# Patient Record
Sex: Male | Born: 1980 | Hispanic: Yes | Marital: Single | Smoking: Current every day smoker
Health system: Southern US, Community
[De-identification: ages and names within clinical notes are randomized; demographics above are authoritative.]

## PROBLEM LIST (undated history)

## (undated) DIAGNOSIS — R519 Headache, unspecified: Secondary | ICD-10-CM

## (undated) HISTORY — PX: OTHER SURGICAL HISTORY: SHX169

---

## 2017-05-23 ENCOUNTER — Other Ambulatory Visit: Payer: Self-pay

## 2017-05-23 ENCOUNTER — Emergency Department
Admission: EM | Admit: 2017-05-23 | Discharge: 2017-05-23 | Disposition: A | Discharge status: Other Acute Inpatient Hospital | Payer: Self-pay | Attending: Emergency Medicine | Admitting: Emergency Medicine

## 2017-05-23 ENCOUNTER — Emergency Department: Payer: Self-pay

## 2017-05-23 DIAGNOSIS — Y998 Other external cause status: Secondary | ICD-10-CM | POA: Insufficient documentation

## 2017-05-23 DIAGNOSIS — Y9389 Activity, other specified: Secondary | ICD-10-CM | POA: Insufficient documentation

## 2017-05-23 DIAGNOSIS — Y929 Unspecified place or not applicable: Secondary | ICD-10-CM | POA: Insufficient documentation

## 2017-05-23 DIAGNOSIS — W3189XA Contact with other specified machinery, initial encounter: Secondary | ICD-10-CM | POA: Insufficient documentation

## 2017-05-23 DIAGNOSIS — S62512A Displaced fracture of proximal phalanx of left thumb, initial encounter for closed fracture: Secondary | ICD-10-CM | POA: Insufficient documentation

## 2017-05-23 DIAGNOSIS — S62512B Displaced fracture of proximal phalanx of left thumb, initial encounter for open fracture: Secondary | ICD-10-CM

## 2017-05-23 MED ORDER — TETANUS-DIPHTH-ACELL PERTUSSIS 5-2.5-18.5 LF-MCG/0.5 IM SUSP
0.5000 mL | Freq: Once | INTRAMUSCULAR | Status: DC
  Filled 2017-05-23: qty 0.5

## 2017-05-23 MED ORDER — LIDOCAINE HCL (PF) 1 % IJ SOLN
INTRAMUSCULAR | Status: AC
  Filled 2017-05-23: qty 5

## 2017-05-23 MED ORDER — CEFAZOLIN SODIUM-DEXTROSE 2-4 GM/100ML-% IV SOLN
2.0000 g | Freq: Once | INTRAVENOUS | Status: DC
  Filled 2017-05-23: qty 100

## 2017-05-23 MED ORDER — CEFTRIAXONE SODIUM 1 G IJ SOLR
2.0000 g | Freq: Once | INTRAMUSCULAR | Status: AC
Start: 2017-05-23 — End: 2017-05-23
  Administered 2017-05-23: 2 g via INTRAMUSCULAR
  Filled 2017-05-23: qty 20

## 2017-05-23 MED ORDER — OXYCODONE-ACETAMINOPHEN 5-325 MG PO TABS
2.0000 | ORAL_TABLET | Freq: Once | ORAL | Status: AC
  Administered 2017-05-23: 2 via ORAL
  Filled 2017-05-23: qty 2

## 2017-05-23 NOTE — ED Notes (Signed)
RN called [facility] ED and spoke to Harless NakayamaKatie, Charge RN and gave report. No further questions. MD has updated pt on POV transfer. Pt in NAD and friend verbalized he will be driving the transfer.

## 2017-05-23 NOTE — ED Provider Notes (Signed)
[facility] Emergency Department Provider Note  ____________________________________________  Time seen: Approximately 3:14 PM  I have reviewed the triage vital signs and the nursing notes.   HISTORY  Chief Complaint Laceration   HPI Terry Ramirez is a 37 y.o. male no significant past medical history who presents for evaluation of a laceration of his left thumb. Patient is right-handed. He reports that he was working with an Mining engineerelectric saw when he sustained the laceration. This happened around 2 PM. Last tetanus shot was 1 year ago. Patient is complaining of numbness from the laceration distally on his left thumb. He is also complaining of mild to moderate constant sharp pain that is worse with movement of the finger. He denies any other injuries.  PMH None  Meds None  Allergies Patient has no known allergies.  FH Not relevant  Social History Smoker: no Alcohol: no Drugs: no  Review of Systems  Constitutional: Negative for fever. Eyes: Negative for visual changes. ENT: Negative for sore throat. Neck: No neck pain  Cardiovascular: Negative for chest pain. Respiratory: Negative for shortness of breath. Gastrointestinal: Negative for abdominal pain, vomiting or diarrhea. Musculoskeletal: Negative for back pain. Skin: Negative for rash. + laceration of there L thumb Neurological: Negative for headaches, weakness or numbness. Psych: No SI or HI  ____________________________________________   PHYSICAL EXAM:  VITAL SIGNS: ED Triage Vitals  Enc Vitals Group     BP 05/23/17 1359 114/64     Pulse Rate 05/23/17 1359 (!) 104     Resp 05/23/17 1359 20     Temp 05/23/17 1359 98.6 F (37 C)     Temp Source 05/23/17 1359 Oral     SpO2 05/23/17 1359 97 %     Weight --      Height 05/23/17 1401 5\' 10"  (1.778 m)     Head Circumference --      Peak Flow --      Pain Score 05/23/17 1400 5     Pain Loc --      Pain Edu? --      Excl. in GC?  --     Constitutional: Alert and oriented. Well appearing and in no apparent distress. HEENT:      Head: Normocephalic and atraumatic.         Eyes: Conjunctivae are normal. Sclera is non-icteric.       Mouth/Throat: Mucous membranes are moist.       Neck: Supple with no signs of meningismus. Cardiovascular: Regular rate and rhythm. No murmurs, gallops, or rubs. 2+ symmetrical distal pulses are present in all extremities. No JVD. Respiratory: Normal respiratory effort. Lungs are clear to auscultation bilaterally. No wheezes, crackles, or rhonchi.  Gastrointestinal: Soft, non tender, and non distended with positive bowel sounds. No rebound or guarding. Musculoskeletal: There is an open fracture of the proximal phalanx of the left thumb, hemostatic, the thumb is dusky and cyanotic with no capillary refill, patient is able to flex and extend the thumb, decrease sensation on the distal aspect of thumb Neurologic: Normal speech and language. Face is symmetric. Moving all extremities. No gross focal neurologic deficits are appreciated. Skin: Skin is warm, dry and intact. No rash noted. Psychiatric: Mood and affect are normal. Speech and behavior are normal.  ____________________________________________   LABS (all labs ordered are listed, but only abnormal results are displayed)  Labs Reviewed - No data to display ____________________________________________  EKG  none  ____________________________________________  RADIOLOGY  I have personally reviewed the  images performed during this visit and I agree with the Radiologist's read.   Interpretation by Radiologist:  Dg Finger Thumb Left  Result Date: 05/23/2017 CLINICAL DATA:  Left thumb laceration with saw. EXAM: LEFT THUMB 2+V COMPARISON:  None. FINDINGS: Comminuted and displaced fracture is seen in the first proximal phalanx with large associated soft tissue laceration. Joint spaces are intact. No definite radiopaque foreign body  seen in region of laceration. IMPRESSION: Large soft tissue laceration involving left thumb with associated comminuted and displaced fracture of first proximal phalanx. Electronically Signed   By: Lupita Raider, M.D.   On: 05/23/2017 15:25      ____________________________________________   PROCEDURES  Procedure(s) performed: None Procedures Critical Care performed:  None ____________________________________________   INITIAL IMPRESSION / ASSESSMENT AND PLAN / ED COURSE  R handed patient presents with a open fracture of the left thumb sustained by a electric saw at 2 PM. Exam is concerning and showing an open fracture of the proximal phalanx of the left thumb, hemostatic, the thumb is dusky and cyanotic with no capillary refill, patient is able to flex and extend the thumb, decrease sensation on the distal aspect of thumb. will wash the wound thoroughly, tetanus up to date, will give Ancef and speak with hand surgery [facility] for transfer.  Clinical Course as of May 24 1626  Sat May 23, 2017  1612 Patient accepted by Dr. Marin Shutter at [facility] for transfer. Will go via private vehicle to expedite care. Transfer ED to ED.    [CV]    Clinical Course User Index [CV] Don Perking Washington, MD     As part of my medical decision making, I reviewed the following data within the electronic MEDICAL RECORD NUMBER Nursing notes reviewed and incorporated, Radiograph reviewed , A consult was requested and obtained from this/these consultant(s) Hand Surgery, Notes from prior ED visits and [place] Controlled Substance Database    Pertinent labs & imaging results that were available during my care of the patient were reviewed by me and considered in my medical decision making (see chart for details).    ____________________________________________   FINAL CLINICAL IMPRESSION(S) / ED DIAGNOSES  Final diagnoses:  Open displaced fracture of proximal phalanx of left thumb, initial encounter      NEW  MEDICATIONS STARTED DURING THIS VISIT:  ED Discharge Orders    None       Note:  This document was prepared using Dragon voice recognition software and may include unintentional dictation errors.    Nita Sickle, MD 05/23/17 [phone]

## 2017-05-23 NOTE — ED Notes (Signed)
Discoloration noted to the left thumb below the laceration. Tendons noted in laceration. No bone noted. Bleeding is controlled but continues to weep a small amount of blood into dressing. No evidence of arterial bleed and no part of description by pt sounds arterial in nature.

## 2017-05-23 NOTE — ED Notes (Signed)
New dressing applied to thumb. Bleeding remains slow but under control. Finger was soaked in betadine and sterile water for extended amount of time.

## 2017-05-23 NOTE — ED Triage Notes (Signed)
Cut thumb L hand with electrics saw about 30 min prior to arrival. Glove on at arrival. Removed. Deep lac back of L thumb. Is able to move thumb and bleeding controlled.

## 2019-03-26 IMAGING — DX DG FINGER THUMB 2+V*L*
3 series · 3 of 3 positions shown · non-contrast
Comparison: None.

CLINICAL DATA: Left thumb laceration with saw.

EXAM:
LEFT THUMB 2+V

[finger ap]
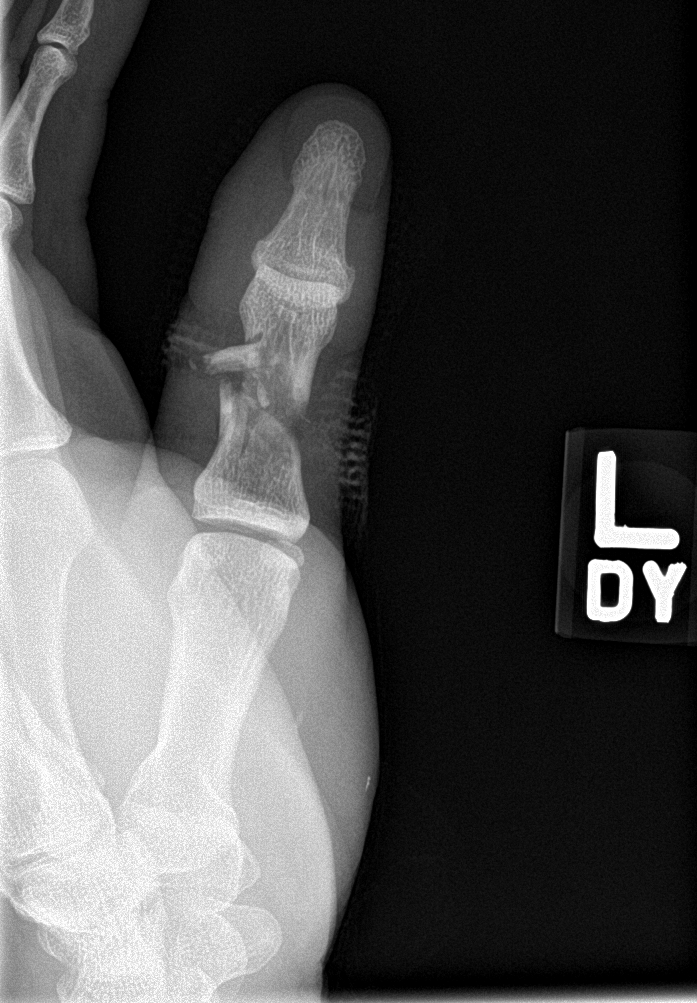

[finger obl]
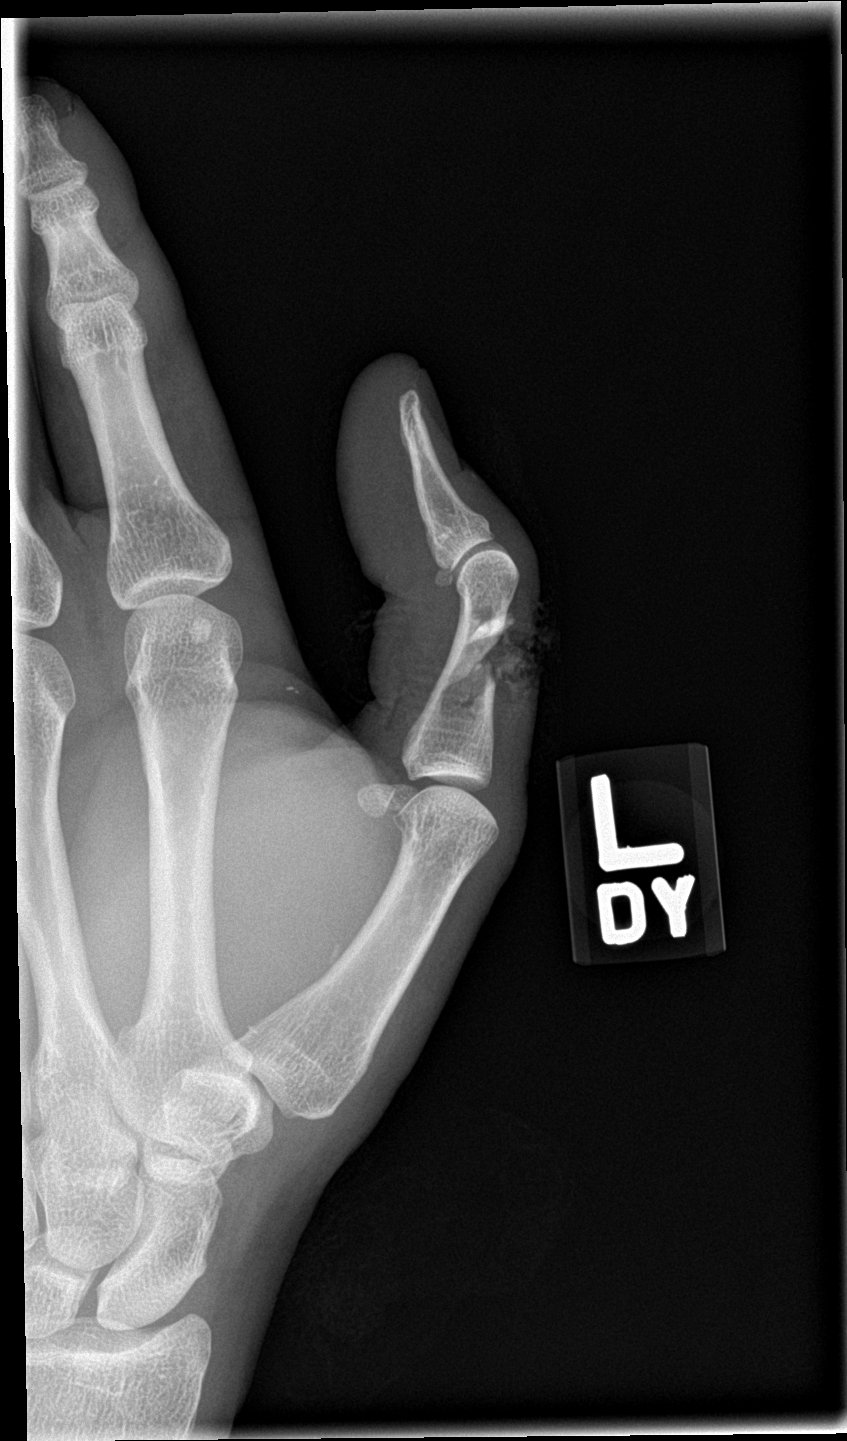

[finger lat]
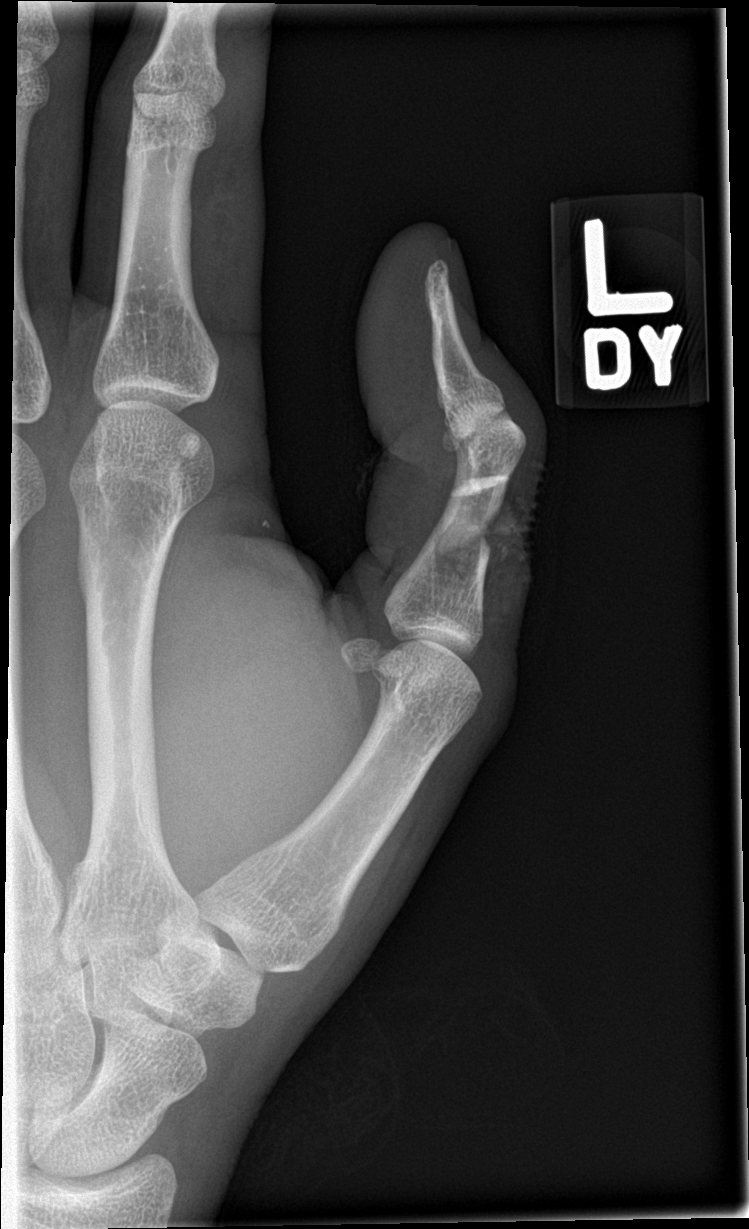

[3 of 3 positions shown; findings below may reference images not displayed]

FINDINGS: Comminuted and displaced fracture is seen in the first proximal
phalanx with large associated soft tissue laceration. Joint spaces
are intact. No definite radiopaque foreign body seen in region of
laceration.
IMPRESSION: Large soft tissue laceration involving left thumb with associated
comminuted and displaced fracture of first proximal phalanx.

## 2022-12-08 ENCOUNTER — Emergency Department (HOSPITAL_COMMUNITY)
Admission: EM | Admit: 2022-12-08 | Discharge: 2022-12-08 | Disposition: A | Discharge status: Home / Self Care | Payer: Self-pay | Attending: Emergency Medicine | Admitting: Emergency Medicine

## 2022-12-08 ENCOUNTER — Encounter (HOSPITAL_COMMUNITY): Payer: Self-pay | Admitting: Orthopedic Surgery

## 2022-12-08 ENCOUNTER — Inpatient Hospital Stay (HOSPITAL_COMMUNITY): Payer: Self-pay | Admitting: Anesthesiology

## 2022-12-08 ENCOUNTER — Emergency Department (HOSPITAL_COMMUNITY): Payer: Self-pay

## 2022-12-08 ENCOUNTER — Encounter (HOSPITAL_COMMUNITY)
Admission: AD | Disposition: A | Discharge status: Home / Self Care | Payer: Self-pay | Source: Ambulatory Visit | Attending: Orthopedic Surgery

## 2022-12-08 ENCOUNTER — Ambulatory Visit (HOSPITAL_COMMUNITY)
Admission: AD | Admit: 2022-12-08 | Discharge: 2022-12-08 | Disposition: A | Discharge status: Home / Self Care | Payer: Self-pay | Source: Ambulatory Visit | Attending: Orthopedic Surgery | Admitting: Orthopedic Surgery

## 2022-12-08 ENCOUNTER — Other Ambulatory Visit (HOSPITAL_COMMUNITY): Payer: Self-pay

## 2022-12-08 ENCOUNTER — Ambulatory Visit (HOSPITAL_COMMUNITY): Admit: 2022-12-08 | Payer: Self-pay

## 2022-12-08 ENCOUNTER — Other Ambulatory Visit: Payer: Self-pay

## 2022-12-08 ENCOUNTER — Encounter (HOSPITAL_COMMUNITY)
Admission: EM | Disposition: A | Discharge status: Home / Self Care | Payer: Self-pay | Source: Home / Self Care | Attending: Emergency Medicine

## 2022-12-08 ENCOUNTER — Ambulatory Visit (HOSPITAL_COMMUNITY): Admission: AD | Admit: 2022-12-08 | Payer: Self-pay | Source: Ambulatory Visit | Admitting: Orthopedic Surgery

## 2022-12-08 ENCOUNTER — Encounter (HOSPITAL_COMMUNITY): Payer: Self-pay

## 2022-12-08 DIAGNOSIS — S67190A Crushing injury of right index finger, initial encounter: Secondary | ICD-10-CM

## 2022-12-08 DIAGNOSIS — W230XXA Caught, crushed, jammed, or pinched between moving objects, initial encounter: Secondary | ICD-10-CM | POA: Insufficient documentation

## 2022-12-08 DIAGNOSIS — W231XXA Caught, crushed, jammed, or pinched between stationary objects, initial encounter: Secondary | ICD-10-CM | POA: Insufficient documentation

## 2022-12-08 DIAGNOSIS — S62630B Displaced fracture of distal phalanx of right index finger, initial encounter for open fracture: Secondary | ICD-10-CM | POA: Insufficient documentation

## 2022-12-08 DIAGNOSIS — Z23 Encounter for immunization: Secondary | ICD-10-CM | POA: Insufficient documentation

## 2022-12-08 DIAGNOSIS — Y990 Civilian activity done for income or pay: Secondary | ICD-10-CM | POA: Insufficient documentation

## 2022-12-08 HISTORY — PX: PERCUTANEOUS PINNING: SHX2209

## 2022-12-08 HISTORY — PX: I & D EXTREMITY: SHX5045

## 2022-12-08 HISTORY — DX: Headache, unspecified: R51.9

## 2022-12-08 LAB — CBC
HCT: 47.2 % (ref 39.0–52.0)
Hemoglobin: 15.9 g/dL (ref 13.0–17.0)
MCH: 29 pg (ref 26.0–34.0)
MCHC: 33.7 g/dL (ref 30.0–36.0)
MCV: 86.1 fL (ref 80.0–100.0)
Platelets: 256 10*3/uL (ref 150–400)
RBC: 5.48 MIL/uL (ref 4.22–5.81)
RDW: 13.2 % (ref 11.5–15.5)
WBC: 11.9 10*3/uL — ABNORMAL HIGH (ref 4.0–10.5)
nRBC: 0 % (ref 0.0–0.2)

## 2022-12-08 LAB — SURGICAL PCR SCREEN
MRSA, PCR: NEGATIVE
Staphylococcus aureus: NEGATIVE

## 2022-12-08 LAB — BASIC METABOLIC PANEL
Anion gap: 9 (ref 5–15)
BUN: 11 mg/dL (ref 6–20)
CO2: 23 mmol/L (ref 22–32)
Calcium: 9 mg/dL (ref 8.9–10.3)
Chloride: 103 mmol/L (ref 98–111)
Creatinine, Ser: 0.99 mg/dL (ref 0.61–1.24)
GFR, Estimated: >60 mL/min (ref >60)
Glucose, Bld: 100 mg/dL — ABNORMAL HIGH (ref 70–99)
Potassium: 3.9 mmol/L (ref 3.5–5.1)
Sodium: 135 mmol/L (ref 135–145)

## 2022-12-08 SURGERY — IRRIGATION AND DEBRIDEMENT EXTREMITY
Anesthesia: Monitor Anesthesia Care | Laterality: Right

## 2022-12-08 SURGERY — IRRIGATION AND DEBRIDEMENT EXTREMITY
Anesthesia: General | Laterality: Right

## 2022-12-08 MED ORDER — KETOROLAC TROMETHAMINE 30 MG/ML IJ SOLN
INTRAMUSCULAR | Status: AC
  Filled 2022-12-08: qty 1

## 2022-12-08 MED ORDER — CEPHALEXIN 500 MG PO CAPS: 500.0000 mg | ORAL_CAPSULE | Freq: Two times a day (BID) | ORAL | 0 refills | Status: AC

## 2022-12-08 MED ORDER — ORAL CARE MOUTH RINSE: 15.0000 mL | Freq: Once | OROMUCOSAL | Status: DC

## 2022-12-08 MED ORDER — POVIDONE-IODINE 10 % EX SWAB: 2.0000 | Freq: Once | CUTANEOUS | Status: DC

## 2022-12-08 MED ORDER — CEPHALEXIN 500 MG PO CAPS
500.0000 mg | ORAL_CAPSULE | Freq: Two times a day (BID) | ORAL | 0 refills | Status: AC
  Filled 2022-12-08: qty 20, 10d supply, fill #0

## 2022-12-08 MED ORDER — CHLORHEXIDINE GLUCONATE 0.12 % MT SOLN: 15.0000 mL | Freq: Once | OROMUCOSAL | Status: DC

## 2022-12-08 MED ORDER — OXYCODONE HCL 5 MG PO TABS: 5.0000 mg | ORAL_TABLET | Freq: Once | ORAL | Status: DC | PRN

## 2022-12-08 MED ORDER — TETANUS-DIPHTH-ACELL PERTUSSIS 5-2.5-18.5 LF-MCG/0.5 IM SUSY
0.5000 mL | PREFILLED_SYRINGE | Freq: Once | INTRAMUSCULAR | Status: AC
  Administered 2022-12-08: 0.5 mL via INTRAMUSCULAR
  Filled 2022-12-08: qty 0.5

## 2022-12-08 MED ORDER — LIDOCAINE HCL (PF) 1 % IJ SOLN
INTRAMUSCULAR | Status: AC
  Filled 2022-12-08: qty 30

## 2022-12-08 MED ORDER — SODIUM CHLORIDE 0.9 % IV SOLN
INTRAVENOUS | Status: DC | PRN
Start: 2022-12-08 — End: 2022-12-08

## 2022-12-08 MED ORDER — KETAMINE HCL 50 MG/5ML IJ SOSY
PREFILLED_SYRINGE | INTRAMUSCULAR | Status: AC
  Filled 2022-12-08: qty 5

## 2022-12-08 MED ORDER — CHLORHEXIDINE GLUCONATE 0.12 % MT SOLN
OROMUCOSAL | Status: AC
  Filled 2022-12-08: qty 15

## 2022-12-08 MED ORDER — ONDANSETRON HCL 4 MG/2ML IJ SOLN: 4.0000 mg | Freq: Four times a day (QID) | INTRAMUSCULAR | Status: DC | PRN

## 2022-12-08 MED ORDER — GLYCOPYRROLATE PF 0.2 MG/ML IJ SOSY
PREFILLED_SYRINGE | INTRAMUSCULAR | Status: DC | PRN
Start: 2022-12-08 — End: 2022-12-08
  Administered 2022-12-08: .2 mg via INTRAVENOUS

## 2022-12-08 MED ORDER — MIDAZOLAM HCL 2 MG/2ML IJ SOLN
INTRAMUSCULAR | Status: AC
  Filled 2022-12-08: qty 2

## 2022-12-08 MED ORDER — MIDAZOLAM HCL 2 MG/2ML IJ SOLN
INTRAMUSCULAR | Status: DC | PRN
  Administered 2022-12-08: 2 mg via INTRAVENOUS

## 2022-12-08 MED ORDER — CEFAZOLIN SODIUM-DEXTROSE 1-4 GM/50ML-% IV SOLN
1.0000 g | Freq: Once | INTRAVENOUS | Status: AC
  Administered 2022-12-08: 1 g via INTRAVENOUS
  Filled 2022-12-08: qty 50

## 2022-12-08 MED ORDER — FENTANYL CITRATE (PF) 250 MCG/5ML IJ SOLN
INTRAMUSCULAR | Status: AC
  Filled 2022-12-08: qty 5

## 2022-12-08 MED ORDER — CHLORHEXIDINE GLUCONATE 4 % EX SOLN: 60.0000 mL | Freq: Once | CUTANEOUS | Status: DC

## 2022-12-08 MED ORDER — FENTANYL CITRATE (PF) 250 MCG/5ML IJ SOLN
INTRAMUSCULAR | Status: DC | PRN
Start: 2022-12-08 — End: 2022-12-08
  Administered 2022-12-08: 100 ug via INTRAVENOUS

## 2022-12-08 MED ORDER — LACTATED RINGERS IV SOLN: INTRAVENOUS | Status: DC

## 2022-12-08 MED ORDER — PROPOFOL 10 MG/ML IV BOLUS
INTRAVENOUS | Status: AC
  Filled 2022-12-08: qty 20

## 2022-12-08 MED ORDER — LIDOCAINE 2% (20 MG/ML) 5 ML SYRINGE
INTRAMUSCULAR | Status: AC
  Filled 2022-12-08: qty 5

## 2022-12-08 MED ORDER — KETOROLAC TROMETHAMINE 30 MG/ML IJ SOLN
INTRAMUSCULAR | Status: DC | PRN
Start: 2022-12-08 — End: 2022-12-08
  Administered 2022-12-08: 30 mg via INTRAVENOUS

## 2022-12-08 MED ORDER — OXYCODONE HCL 5 MG/5ML PO SOLN: 5.0000 mg | Freq: Once | ORAL | Status: DC | PRN

## 2022-12-08 MED ORDER — KETAMINE HCL 50 MG/5ML IJ SOSY
PREFILLED_SYRINGE | INTRAMUSCULAR | Status: DC | PRN
Start: 2022-12-08 — End: 2022-12-08
  Administered 2022-12-08: 5 mg via INTRAVENOUS
  Administered 2022-12-08: 15 mg via INTRAVENOUS

## 2022-12-08 MED ORDER — ONDANSETRON HCL 4 MG/2ML IJ SOLN
INTRAMUSCULAR | Status: AC
  Filled 2022-12-08: qty 2

## 2022-12-08 MED ORDER — PROPOFOL 500 MG/50ML IV EMUL
INTRAVENOUS | Status: DC | PRN
Start: 2022-12-08 — End: 2022-12-08
  Administered 2022-12-08: 100 ug/kg/min via INTRAVENOUS

## 2022-12-08 MED ORDER — ONDANSETRON HCL 4 MG/2ML IJ SOLN
INTRAMUSCULAR | Status: DC | PRN
  Administered 2022-12-08: 4 mg via INTRAVENOUS

## 2022-12-08 MED ORDER — LIDOCAINE HCL (PF) 1 % IJ SOLN
INTRAMUSCULAR | Status: DC | PRN
Start: 2022-12-08 — End: 2022-12-08
  Administered 2022-12-08: 10 mL

## 2022-12-08 MED ORDER — CEFAZOLIN SODIUM-DEXTROSE 2-4 GM/100ML-% IV SOLN
2.0000 g | INTRAVENOUS | Status: AC
  Administered 2022-12-08: 2 g via INTRAVENOUS
  Filled 2022-12-08: qty 100

## 2022-12-08 MED ORDER — FENTANYL CITRATE (PF) 100 MCG/2ML IJ SOLN: 25.0000 ug | INTRAMUSCULAR | Status: DC | PRN

## 2022-12-08 SURGICAL SUPPLY — 84 items
APL PRP STRL LF DISP 70% ISPRP (MISCELLANEOUS) ×1
APL SKNCLS STERI-STRIP NONHPOA (GAUZE/BANDAGES/DRESSINGS)
BAG COUNTER SPONGE SURGICOUNT (BAG) ×1 IMPLANT
BAG SPNG CNTER NS LX DISP (BAG) ×1
BENZOIN TINCTURE PRP APPL 2/3 (GAUZE/BANDAGES/DRESSINGS) IMPLANT
BLADE CLIPPER SURG (BLADE) IMPLANT
BNDG CMPR 5X3 KNIT ELC UNQ LF (GAUZE/BANDAGES/DRESSINGS) ×1
BNDG CMPR 5X4 CHSV STRCH STRL (GAUZE/BANDAGES/DRESSINGS) ×1
BNDG CMPR 75X21 PLY HI ABS (MISCELLANEOUS)
BNDG CMPR 9X4 STRL LF SNTH (GAUZE/BANDAGES/DRESSINGS)
BNDG COHESIVE 4X5 TAN STRL LF (GAUZE/BANDAGES/DRESSINGS) IMPLANT
BNDG ELASTIC 2X5.8 VLCR STR LF (GAUZE/BANDAGES/DRESSINGS) ×1 IMPLANT
BNDG ELASTIC 3INX 5YD STR LF (GAUZE/BANDAGES/DRESSINGS) ×1 IMPLANT
BNDG ELASTIC 4X5.8 VLCR STR LF (GAUZE/BANDAGES/DRESSINGS) ×1 IMPLANT
BNDG ESMARK 4X9 LF (GAUZE/BANDAGES/DRESSINGS) IMPLANT
BNDG GAUZE DERMACEA FLUFF 4 (GAUZE/BANDAGES/DRESSINGS) ×2 IMPLANT
BNDG GZE DERMACEA 4 6PLY (GAUZE/BANDAGES/DRESSINGS) ×2
CAP PIN ORTHO PINK (CAP) IMPLANT
CHLORAPREP W/TINT 26 (MISCELLANEOUS) ×1 IMPLANT
CORD BIPOLAR FORCEPS 12FT (ELECTRODE) ×1 IMPLANT
COVER BACK TABLE 60X90IN (DRAPES) IMPLANT
COVER K-WIRE PIN .062/1.6 (PIN) ×1
COVER MAYO STAND STRL (DRAPES) ×1 IMPLANT
COVER SURGICAL LIGHT HANDLE (MISCELLANEOUS) ×1 IMPLANT
CUFF TOURN SGL QUICK 18X4 (TOURNIQUET CUFF) ×1 IMPLANT
CUFF TOURN SGL QUICK 24 (TOURNIQUET CUFF)
CUFF TRNQT CYL 24X4X16.5-23 (TOURNIQUET CUFF) IMPLANT
DRAIN PENROSE 18X1/4 LTX STRL (DRAIN) IMPLANT
DRAPE HALF SHEET 40X57 (DRAPES) ×1 IMPLANT
DRAPE OEC MINIVIEW 54X84 (DRAPES) ×1 IMPLANT
DRAPE SURG 17X23 STRL (DRAPES) ×1 IMPLANT
DRSG ADAPTIC 3X8 NADH LF (GAUZE/BANDAGES/DRESSINGS) ×1 IMPLANT
DRSG EMULSION OIL 3X3 NADH (GAUZE/BANDAGES/DRESSINGS) IMPLANT
GAUZE SPONGE 4X4 12PLY STRL (GAUZE/BANDAGES/DRESSINGS) ×1 IMPLANT
GAUZE STRETCH 2X75IN STRL (MISCELLANEOUS) IMPLANT
GAUZE XEROFORM 1X8 LF (GAUZE/BANDAGES/DRESSINGS) ×1 IMPLANT
GLOVE BIO SURGEON STRL SZ7 (GLOVE) ×1 IMPLANT
GLOVE BIOGEL PI IND STRL 8.5 (GLOVE) ×1 IMPLANT
GLOVE SURG ORTHO 8.0 STRL STRW (GLOVE) ×1 IMPLANT
GLOVE SURG UNDER POLY LF SZ7 (GLOVE) ×1 IMPLANT
GOWN STRL REUS W/ TWL LRG LVL3 (GOWN DISPOSABLE) ×2 IMPLANT
GOWN STRL REUS W/ TWL XL LVL3 (GOWN DISPOSABLE) ×2 IMPLANT
GOWN STRL REUS W/TWL LRG LVL3 (GOWN DISPOSABLE) ×2
GOWN STRL REUS W/TWL XL LVL3 (GOWN DISPOSABLE) ×2
IV CATH 18G X1.75 CATHLON (IV SOLUTION) IMPLANT
KIT BASIN OR (CUSTOM PROCEDURE TRAY) ×1 IMPLANT
KIT TURNOVER KIT B (KITS) ×1 IMPLANT
LOOP VASCLR MAXI BLUE 18IN ST (MISCELLANEOUS) IMPLANT
LOOP VASCULAR MAXI 18 BLUE (MISCELLANEOUS)
LOOPS VASCLR MAXI BLUE 18IN ST (MISCELLANEOUS) IMPLANT
MANIFOLD NEPTUNE II (INSTRUMENTS) IMPLANT
NDL HYPO 25GX1X1/2 BEV (NEEDLE) IMPLANT
NDL HYPO 25X1 1.5 SAFETY (NEEDLE) ×1 IMPLANT
NDL KEITH (NEEDLE) IMPLANT
NEEDLE HYPO 25GX1X1/2 BEV (NEEDLE) IMPLANT
NEEDLE HYPO 25X1 1.5 SAFETY (NEEDLE) ×1 IMPLANT
NEEDLE KEITH (NEEDLE) IMPLANT
NS IRRIG 1000ML POUR BTL (IV SOLUTION) ×1 IMPLANT
PACK ORTHO EXTREMITY (CUSTOM PROCEDURE TRAY) ×1 IMPLANT
PAD ABD 8X10 STRL (GAUZE/BANDAGES/DRESSINGS) ×1 IMPLANT
PAD ARMBOARD 7.5X6 YLW CONV (MISCELLANEOUS) ×2 IMPLANT
PAD CAST 4YDX4 CTTN HI CHSV (CAST SUPPLIES) ×2 IMPLANT
PADDING CAST ABS COTTON 3X4 (CAST SUPPLIES) ×1 IMPLANT
PADDING CAST COTTON 4X4 STRL (CAST SUPPLIES) ×2
PIN GUARD 1.57MM GREEN STERILE (PIN) IMPLANT
SOAP 2 % CHG 4 OZ (WOUND CARE) ×1 IMPLANT
SPLINT PLASTER CAST XFAST 3X15 (CAST SUPPLIES) ×1 IMPLANT
SPONGE T-LAP 4X18 ~~LOC~~+RFID (SPONGE) ×1 IMPLANT
STRIP CLOSURE SKIN 1/2X4 (GAUZE/BANDAGES/DRESSINGS) IMPLANT
SUT ETHILON 4 0 P 3 18 (SUTURE) IMPLANT
SUT FIBERWIRE 3-0 18 TAPR NDL (SUTURE)
SUT NYLON ETHILON 5-0 P-3 1X18 (SUTURE) IMPLANT
SUT PROLENE 4 0 P 3 18 (SUTURE) IMPLANT
SUTURE FIBERWR 3-0 18 TAPR NDL (SUTURE) IMPLANT
SWAB CULTURE ESWAB REG 1ML (MISCELLANEOUS) IMPLANT
SYR BULB EAR ULCER 3OZ GRN STR (SYRINGE) ×1 IMPLANT
SYR CONTROL 10ML LL (SYRINGE) IMPLANT
TOWEL GREEN STERILE (TOWEL DISPOSABLE) ×1 IMPLANT
TOWEL GREEN STERILE FF (TOWEL DISPOSABLE) ×2 IMPLANT
TUBE CONNECTING 12X1/4 (SUCTIONS) IMPLANT
UNDERPAD 30X36 HEAVY ABSORB (UNDERPADS AND DIAPERS) ×1 IMPLANT
VASCULAR TIE MAXI BLUE 18IN ST (MISCELLANEOUS)
WATER STERILE IRR 1000ML POUR (IV SOLUTION) ×1 IMPLANT
YANKAUER SUCT BULB TIP NO VENT (SUCTIONS) IMPLANT

## 2022-12-08 NOTE — Op Note (Signed)
NAME: Terry Ramirez MEDICAL RECORD NO: [id number] DATE OF BIRTH: [birth year] FACILITY: [facility] LOCATION: [facility] OR PHYSICIAN: Samuella Cota, MD   OPERATIVE REPORT   DATE OF PROCEDURE: 12/08/22    PREOPERATIVE DIAGNOSIS: Right index finger open distal phalanx fracture   POSTOPERATIVE DIAGNOSIS: Right index finger open distal phalanx fracture   PROCEDURE: Right index finger open distal phalanx fracture irrigation and debridement Right index finger open distal phalanx fracture fixation with pinning   SURGEON:  Samuella Cota, M.D.   ASSISTANT: None   ANESTHESIA:  Local with sedation   INTRAVENOUS FLUIDS:  Per anesthesia flow sheet.   ESTIMATED BLOOD LOSS:  Minimal.   COMPLICATIONS:  None.   SPECIMENS:  none   TOURNIQUET TIME:   Not utilized for this case   DISPOSITION:  Stable to PACU.   INDICATIONS: This is a 42 year old male who sustained a crush injury to the right index finger and sustained an open fracture of the index finger distal phalanx.  Given the amount of soft tissue disruption, patient was indicated for right index finger irrigation debridement with fixation of the distal phalanx fracture.  Risks and benefits of surgery were discussed including the risks of infection, bleeding, scarring, stiffness, nerve injury, vascular injury, tendon injury, need for subsequent operation, ongoing infection, need for recurrent washout as well as, nonunion, malunion.  He voiced understanding of these risks and elected to proceed.  OPERATIVE COURSE: Patient was seen and identified in the preoperative area and marked appropriately.  Surgical consent had been signed. Preoperative IV antibiotic prophylaxis was given. He was transferred to the operating room and placed in supine position with the Right Right upper extremity on an arm board.  Sedation was induced by the anesthesiologist.  Right upper extremity was prepped and draped in normal sterile orthopedic fashion.  A surgical  pause was performed between the surgeons, anesthesia, and operating room staff and all were in agreement as to the patient, procedure, and site of procedure.  Tourniquet was placed and padded appropriately to the right upper arm, however this was not utilized for this case.  10 cc of 1% lidocaine plain was utilized for digital block purposes of the index finger.  Copious irrigation was then performed of the wound site at the distal phalanx.  Once appropriate irrigation was completed, gentle debridement of the soft tissue in this region was performed.  Nailbed was carefully inspected, and noted to be intact without significant disruption.  Gentle reduction maneuver was then performed of the distal phalanx fracture under biplanar fluoroscopy.  0.062 K wire was then inserted in retrograde fashion to stabilize the fracture site.  Wire placement was confirmed with biplanar fluoroscopy.  There was noted to be appropriate bony apposition.  At this juncture, 4-0 nylon was then utilized to close the wound from the initial trauma over the dorsal aspect of the index finger.  Wound is approximately 2 cm in nature stemming from the eponychial fold to the level of the DIP joint.  Skin edges were able to be well-approximated.  Sterile dressing was then applied followed by application of a static finger splint for the index finger.  The patient was awoken from anesthesia safely and taken to PACU in stable condition.  He will be discharged home tonight with oral antibiotics.  I will see him back in the office in 1 week for postoperative followup.     Samuella Cota, MD Electronically signed, 12/08/22

## 2022-12-08 NOTE — Anesthesia Preprocedure Evaluation (Signed)
Anesthesia Evaluation  Patient identified by MRN, date of birth, ID band Patient awake    Reviewed: Allergy & Precautions, H&P , NPO status , Patient's Chart, lab work & pertinent test results  Airway Mallampati: II   Neck ROM: full    Dental   Pulmonary Current Smoker   breath sounds clear to auscultation       Cardiovascular negative cardio ROS  Rhythm:regular Rate:Normal     Neuro/Psych  Headaches    GI/Hepatic   Endo/Other    Renal/GU      Musculoskeletal   Abdominal   Peds  Hematology   Anesthesia Other Findings   Reproductive/Obstetrics                             Anesthesia Physical Anesthesia Plan  ASA: 2  Anesthesia Plan: General   Post-op Pain Management:    Induction: Intravenous  PONV Risk Score and Plan: 1 and Ondansetron, Dexamethasone, Midazolam and Treatment may vary due to age or medical condition  Airway Management Planned: LMA  Additional Equipment:   Intra-op Plan:   Post-operative Plan: Extubation in OR  Informed Consent: I have reviewed the patients History and Physical, chart, labs and discussed the procedure including the risks, benefits and alternatives for the proposed anesthesia with the patient or authorized representative who has indicated his/her understanding and acceptance.     Dental advisory given  Plan Discussed with: CRNA, Anesthesiologist and Surgeon  Anesthesia Plan Comments:        Anesthesia Quick Evaluation

## 2022-12-08 NOTE — Consult Note (Signed)
Reason for Consult:Right index finger open fx Referring Physician: Bethann Berkshire Time called: 1105 Time at bedside: 1141   Terry Ramirez is an 42 y.o. male.  HPI: Hersh was working today and struck his right index finger with a hammer. He had a large wound and felt like it was broken. He came to the ED where x-rays showed a distal phalanx fx and hand surgery was consulted. He is RHD and works in [employer].  History reviewed. No pertinent past medical history.  History reviewed. No pertinent surgical history.  History reviewed. No pertinent family history.  Social History:  reports that he has never smoked. He has never used smokeless tobacco. He reports that he does not currently use alcohol. He reports that he does not currently use drugs.  Allergies: No Known Allergies  Medications: I have reviewed the patient's current medications.  No results found for this or any previous visit (from the past 48 hour(s)).  DG Finger Index Right  Result Date: 12/08/2022 CLINICAL DATA:  Crush injury. EXAM: RIGHT INDEX FINGER 3V COMPARISON:  None Available. FINDINGS: There is comminuted displaced fracture of the midshaft of the distal phalanx of second digit. Soft tissue swelling and irregularity identified as well. No additional fracture or dislocation. Preserved joint spaces and bone mineralization. IMPRESSION: There is comminuted displaced fracture of the midshaft of the distal phalanx of second digit. Soft tissue swelling and irregularity identified as well. Electronically Signed   By: Karen Kays M.D.   On: 12/08/2022 11:32    Review of Systems  HENT:  Negative for ear discharge, ear pain, hearing loss and tinnitus.   Eyes:  Negative for photophobia and pain.  Respiratory:  Negative for cough and shortness of breath.   Cardiovascular:  Negative for chest pain.  Gastrointestinal:  Negative for abdominal pain, nausea and vomiting.  Genitourinary:  Negative for dysuria, flank pain,  frequency and urgency.  Musculoskeletal:  Positive for arthralgias (Right index finger). Negative for back pain, myalgias and neck pain.  Neurological:  Negative for dizziness and headaches.  Hematological:  Does not bruise/bleed easily.  Psychiatric/Behavioral:  The patient is not nervous/anxious.    Blood pressure (!) 146/80, pulse 74, temperature 97.9 F (36.6 C), temperature source Oral, resp. rate 19, height 5\' 9"  (1.753 m), weight 68 kg, SpO2 100%. Physical Exam Constitutional:      General: He is not in acute distress.    Appearance: He is well-developed. He is not diaphoretic.  HENT:     Head: Normocephalic and atraumatic.  Eyes:     General: No scleral icterus.       Right eye: No discharge.        Left eye: No discharge.     Conjunctiva/sclera: Conjunctivae normal.  Cardiovascular:     Rate and Rhythm: Normal rate and regular rhythm.  Pulmonary:     Effort: Pulmonary effort is normal. No respiratory distress.  Musculoskeletal:     Cervical back: Normal range of motion.     Comments: Right shoulder, elbow, wrist, digits- Laceration radial aspect P3 index finger, mod TTP, no instability, no blocks to motion  Sens  Ax/R/M/U intact  Mot   Ax/ R/ PIN/ M/ AIN/ U intact  Rad 2+  Skin:    General: Skin is warm and dry.  Neurological:     Mental Status: He is alert.  Psychiatric:        Mood and Affect: Mood normal.        Behavior: Behavior normal.  Assessment/Plan: Right index finger open fx -- Will need Ancef and tetanus. Plan I&D, pinning at [facility] today with Dr. Denese Killings. Please keep NPO. Anticipate discharge after surgery.    Freeman Caldron, PA-C Orthopedic Surgery [phone] 12/08/2022, 11:50 AM

## 2022-12-08 NOTE — ED Provider Notes (Signed)
[place] EMERGENCY DEPARTMENT AT [facility] Provider Note   CSN: [id number] Arrival date & time: 12/08/22  0865     History  Chief Complaint  Patient presents with   Finger Injury    Terry Ramirez is a 42 y.o. male With noncontributory past medical history who is not up-to-date on his tetanus presents concern for crush injury of right pointer finger.  Patient smashed his right index finger with a sledgehammer accidentally.  He reports that the distal finger is asleep.  HPI     Home Medications Prior to Admission medications   Not on File      Allergies    Patient has no known allergies.    Review of Systems   Review of Systems  All other systems reviewed and are negative.   Physical Exam Updated Vital Signs BP (!) 146/80   Pulse 74   Temp 97.9 F (36.6 C) (Oral)   Resp 19   Ht 5\' 9"  (1.753 m)   Wt 68 kg   SpO2 100%   BMI 22.15 kg/m  Physical Exam Vitals and nursing note reviewed.  Constitutional:      General: He is not in acute distress.    Appearance: Normal appearance.  HENT:     Head: Normocephalic and atraumatic.  Eyes:     General:        Right eye: No discharge.        Left eye: No discharge.  Cardiovascular:     Rate and Rhythm: Normal rate and regular rhythm.  Pulmonary:     Effort: Pulmonary effort is normal. No respiratory distress.  Musculoskeletal:        General: No deformity.  Skin:    General: Skin is warm and dry.     Comments: Open laceration with deformity noted of right pointer finger, nail is attached but damaged  Neurological:     Mental Status: He is alert and oriented to person, place, and time.  Psychiatric:        Mood and Affect: Mood normal.        Behavior: Behavior normal.     ED Results / Procedures / Treatments   Labs (all labs ordered are listed, but only abnormal results are displayed) Labs Reviewed  CBC - Abnormal; Notable for the following components:      Result Value   WBC 11.9 (*)     All other components within normal limits  BASIC METABOLIC PANEL - Abnormal; Notable for the following components:   Glucose, Bld 100 (*)    All other components within normal limits    EKG None  Radiology DG Finger Index Right  Result Date: 12/08/2022 CLINICAL DATA:  Crush injury. EXAM: RIGHT INDEX FINGER 3V COMPARISON:  None Available. FINDINGS: There is comminuted displaced fracture of the midshaft of the distal phalanx of second digit. Soft tissue swelling and irregularity identified as well. No additional fracture or dislocation. Preserved joint spaces and bone mineralization. IMPRESSION: There is comminuted displaced fracture of the midshaft of the distal phalanx of second digit. Soft tissue swelling and irregularity identified as well. Electronically Signed   By: Karen Kays M.D.   On: 12/08/2022 11:32    Procedures Procedures    Medications Ordered in ED Medications  Tdap (BOOSTRIX) injection 0.5 mL (0.5 mLs Intramuscular Given 12/08/22 1112)  ceFAZolin (ANCEF) IVPB 1 g/50 mL premix (0 g Intravenous Stopped 12/08/22 1215)    ED Course/ Medical Decision Making/ A&P  Medical Decision Making Amount and/or Complexity of Data Reviewed Labs: ordered. Radiology: ordered.  Risk Prescription drug management.   This patient is a 42 y.o. male  who presents to the ED for concern of crush injury of right pointer finger with sledgehammer.  Tetanus not up-to-date..   Differential diagnoses prior to evaluation: The emergent differential diagnosis includes, but is not limited to, open fracture, open dislocation, laceration, contusion, versus other. This is not an exhaustive differential.   Past Medical History / Co-morbidities / Social History: Overall noncontributory, not up-to-date on tetanus  Physical Exam: Physical exam performed. The pertinent findings include: Open laceration with deformity noted of right pointer finger, nail is attached  to skin at this time but damage noted, looseness along nailbed.  Fracture is visible and exploration of wound.  Lab Tests/Imaging studies: I personally interpreted labs/imaging and the pertinent results include: CBC with mild elevation of white blood cells 11.9, likely secondary to stress from traumatic injury.  His BMP is unremarkable.  I independently interpreted plain film radiograph of the right index finger which shows significant soft tissue swelling which is consistent with his large laceration, and a displaced comminuted fracture of the distal phalanx is noted.. I agree with the radiologist interpretation.   Medications: I ordered medication including Ancef for open fracture, Tdap for tetanus prophylaxis.  I have reviewed the patients home medicines and have made adjustments as needed.  Consults: Spoke with the Hydrographic surveyor, Dr. Denese Killings, as well as Earney Hamburg, PA-C and the patient will be sent to the OR at [facility] for open fracture repair after wound is cleaned and wrapped.  He will go POV with coworker.  Wound appropriately cleaned and dressed, and patient sent POV with instructions.   Disposition: After consideration of the diagnostic results and the patients response to treatment, I feel that patient will need surgery for open fracture of right index finger.  Ancef administered in the emergency department.  Patient transferred POV to preop admissions.   emergency department workup does not suggest an emergent condition requiring admission or immediate intervention beyond what has been performed at this time. The plan is: as above. The patient is safe for discharge and has been instructed to return immediately for worsening symptoms, change in symptoms or any other concerns.  Final Clinical Impression(s) / ED Diagnoses Final diagnoses:  Open displaced fracture of distal phalanx of right index finger, initial encounter    Rx / DC Orders ED Discharge Orders     None          [place] 12/08/22 1245    Bethann Berkshire, MD 12/11/22 1454

## 2022-12-08 NOTE — Discharge Instructions (Addendum)
Dirjase inmediatamente a la [facility] y dgales que lo estn atendiendo el cirujano, el Dr. Denese Killings, y el mdico asistente Earney Hamburg. Puede dejar el vendaje de la va intravenosa puesto.

## 2022-12-08 NOTE — ED Triage Notes (Signed)
Patient was hammering wood and crushed his right pointer finger. Laceration on inner pointer finger. Stated it feels broken. Tetanus not updated.

## 2022-12-08 NOTE — H&P (Signed)
HAND SURGERY H&P  REQUESTING PHYSICIAN: Samuella Cota, MD   Chief Complaint: Right index finger injury  HPI: Terry Ramirez is a 42 y.o. male who presents with right index finger injury sustained earlier today.  Mechanism described as a crush injury from a hammer.  This is a work-related injury.  He is right-hand dominant.  He is a current smoker.  No other associated injuries to the hand on clinical radiographic workup.  Hand dominance: Right Occupation: Holiday representative  Past Medical History:  Diagnosis Date   Headache    migraines   Past Surgical History:  Procedure Laterality Date   thumb surgery Left    2020   Social History   Socioeconomic History   Marital status: Single    Spouse name: Not on file   Number of children: Not on file   Years of education: Not on file   Highest education level: Not on file  Occupational History   Not on file  Tobacco Use   Smoking status: Every Day    Types: Cigars   Smokeless tobacco: Never  Vaping Use   Vaping status: Never Used  Substance and Sexual Activity   Alcohol use: Not Currently   Drug use: Not Currently   Sexual activity: Not on file  Other Topics Concern   Not on file  Social History Narrative   Not on file   Social Determinants of Health   Financial Resource Strain: Not on file  Food Insecurity: Not on file  Transportation Needs: Not on file  Physical Activity: Not on file  Stress: Not on file  Social Connections: Not on file   History reviewed. No pertinent family history. - negative except otherwise stated in the family history section No Known Allergies Prior to Admission medications   Medication Sig Start Date End Date Taking? Authorizing Provider  ibuprofen (ADVIL) 200 MG tablet Take 200-400 mg by mouth as needed for headache.   Yes [provider]  PRESCRIPTION MEDICATION Take 1 tablet by mouth as needed (headaches/migraines). Sydolil 1mg -50mg -100mg    Yes [provider]    DG Finger Index Right  Result Date: 12/08/2022 CLINICAL DATA:  Crush injury. EXAM: RIGHT INDEX FINGER 3V COMPARISON:  None Available. FINDINGS: There is comminuted displaced fracture of the midshaft of the distal phalanx of second digit. Soft tissue swelling and irregularity identified as well. No additional fracture or dislocation. Preserved joint spaces and bone mineralization. IMPRESSION: There is comminuted displaced fracture of the midshaft of the distal phalanx of second digit. Soft tissue swelling and irregularity identified as well. Electronically Signed   By: Karen Kays M.D.   On: 12/08/2022 11:32   - Positive ROS: All other systems have been reviewed and were otherwise negative with the exception of those mentioned in the HPI and as above.  Physical Exam: General: No acute distress, resting comfortably Cardiovascular: BUE warm and well perfused, normal rate Respiratory: Normal WOB on RA Neurologic: Sensation intact distally Psychiatric: Patient is at baseline mood and affect  Right upper Extremity  Wound notable to the index finger distal phalanx, skin is disrupted at the injury site, there is notable injury to the nailbed and eponychial fold as well.  Finger does have appropriate color distally, hand remains warm well-perfused.      Assessment: This is a 42 year old male who sustained a right index finger distal phalanx fracture which is open in nature with associated likely nailbed injury  Plan: Patient is indicated for right index finger irrigation  debridement with stabilization of the distal phalanx fracture, likely with pin fixation.  Nailbed exploration will be performed and nailbed repair will be performed as needed.  Postoperatively, he will be placed into a splint and be given follow-up in the hand clinic.  IV antibiotics were administered on admission to the emergency department today.  He will receive a repeat dose of IV antibiotics preoperatively and will be  sent home on oral antibiotic therapy.  Surgery will be performed on the day of admission.  Consent has been obtained with use of the language interpreter.  Risks and benefits of the procedure were discussed, risks including but not limited to infection, bleeding, scarring, stiffness, nerve injury, tendon injury, vascular injury, hardware complication if utilized, recurrence of symptoms and need for subsequent operation.  Patient expressed understanding.      Cerys Winget Trevor Mace, M.D. [facility] 4:18 PM

## 2022-12-08 NOTE — Progress Notes (Signed)
CCC Pre-op Review  Pre-op checklist: to be done in Pre-op  NPO: Pt left WL before confirming status  Labs: WNL  Consent: in S&H  H&P: To be done by surgeon  Vitals: WNL  O2 requirements: RA  MAR/PTA review: Will need to be completed in Pre-op  IV: 20G  Floor nurse name:    Additional info:  Pt going to SS by PV per note

## 2022-12-08 NOTE — Discharge Instructions (Signed)
    Hand Surgery Postop Instructions   Dressings: Maintain postoperative dressing until orthopedic follow-up.  Keep operative site clean and dry until orthopedic follow-up.  Wound Care: Keep your hand elevated above the level of your heart.  Do not allow it to dangle by your side. Moving your fingers is advised to stimulate circulation but will depend on the site of your surgery.  If you have a splint applied, your doctor will advise you regarding movement.  Activity: Do not drive or operate machinery until clearance given from physician. No heavy lifting with operative extremity.  Diet:  Drink liquids today or eat a light diet.  You may resume a regular diet tomorrow.    General expectations: Take prescribed medication if given, transition to over-the-counter medication as quickly as possible. Fingers may become slightly swollen.  Call your doctor if any of the following occur: Severe pain not relieved by pain medication. Elevated temperature. Dressing soaked with blood. Inability to move fingers. White or bluish color to fingers.   Terry Ramirez Terry Ramirez, M.D. Hand Surgery [facility]

## 2022-12-08 NOTE — Anesthesia Postprocedure Evaluation (Signed)
Anesthesia Post Note  Patient: [name]  Procedure(s) Performed: IRRIGATION AND DEBRIDEMENT RIGHT LONG INDEX FINGER (Right) PERCUTANEOUS PINNING RIGHT LONG INDEX FINGER (Right)     Patient location during evaluation: PACU Anesthesia Type: MAC Level of consciousness: awake and alert Pain management: pain level controlled Vital Signs Assessment: post-procedure vital signs reviewed and stable Respiratory status: spontaneous breathing, nonlabored ventilation, respiratory function stable and patient connected to nasal cannula oxygen Cardiovascular status: stable and blood pressure returned to baseline Postop Assessment: no apparent nausea or vomiting Anesthetic complications: no   No notable events documented.  Last Vitals:  Vitals:   12/08/22 1750 12/08/22 1800  BP: 109/67 107/71  Pulse: 60 (!) 55  Resp: 12 12  Temp: (!) 36.4 C   SpO2: 93% 92%    Last Pain:  Vitals:   12/08/22 1750  TempSrc:   PainSc: Asleep                 Terry Ramirez

## 2022-12-08 NOTE — Transfer of Care (Signed)
Immediate Anesthesia Transfer of Care Note  Patient: Terry Ramirez  Procedure(s) Performed: IRRIGATION AND DEBRIDEMENT RIGHT LONG INDEX FINGER (Right) PERCUTANEOUS PINNING RIGHT LONG INDEX FINGER (Right)  Patient Location: PACU  Anesthesia Type:MAC  Level of Consciousness: drowsy and patient cooperative  Airway & Oxygen Therapy: Patient Spontanous Breathing and Patient connected to face mask oxygen  Post-op Assessment: Report given to RN and Post -op Vital signs reviewed and stable  Post vital signs: Reviewed and stable  Last Vitals:  Vitals Value Taken Time  BP 109/67 12/08/22 1751  Temp 36.4 C 12/08/22 1750  Pulse 52 12/08/22 1755  Resp 14 12/08/22 1755  SpO2 94 % 12/08/22 1755  Vitals shown include unfiled device data.  Last Pain:  Vitals:   12/08/22 1750  TempSrc:   PainSc: Asleep         Complications: No notable events documented.

## 2022-12-09 ENCOUNTER — Encounter (HOSPITAL_COMMUNITY): Payer: Self-pay | Admitting: Orthopedic Surgery

## 2022-12-09 ENCOUNTER — Other Ambulatory Visit (HOSPITAL_COMMUNITY): Payer: Self-pay

## 2022-12-10 ENCOUNTER — Other Ambulatory Visit (HOSPITAL_COMMUNITY): Payer: Self-pay

## 2022-12-15 ENCOUNTER — Telehealth: Payer: Self-pay | Admitting: Orthopedic Surgery

## 2022-12-15 NOTE — Telephone Encounter (Signed)
Pt had surgery 10/14 and need 1 wk post op. I put him in for 10/28 but not sure if he can wait that long. Please call pt at [phone] . Please call pt about this matter not matter if it's ok to keep 10/28.

## 2022-12-15 NOTE — Telephone Encounter (Signed)
Called and rescheduled patient to this Wednesday

## 2022-12-17 ENCOUNTER — Other Ambulatory Visit (INDEPENDENT_AMBULATORY_CARE_PROVIDER_SITE_OTHER): Payer: Self-pay

## 2022-12-17 ENCOUNTER — Ambulatory Visit (INDEPENDENT_AMBULATORY_CARE_PROVIDER_SITE_OTHER): Payer: Self-pay | Admitting: Orthopedic Surgery

## 2022-12-17 DIAGNOSIS — S62630B Displaced fracture of distal phalanx of right index finger, initial encounter for open fracture: Secondary | ICD-10-CM

## 2022-12-17 NOTE — Progress Notes (Signed)
   Terry Ramirez - 42 y.o. male MRN [id number]  Date of birth: 28-May-1980  Office Visit Note: Visit Date: 12/17/2022 PCP: Patient, No Pcp Per Referred by: No ref. provider found  Subjective:  HPI: Terry Ramirez is a 42 y.o. male who presents today for follow up 1 week status post irrigation and debridement right long index finger with pinning of the distal phalanx.  Pertinent ROS were reviewed with the patient and found to be negative unless otherwise specified above in HPI.   Assessment & Plan: Visit Diagnoses:  1. Open displaced fracture of distal phalanx of right index finger, initial encounter     Plan: He is doing well overall, x-rays today demonstrate stable appearance of the distal phalanx fracture with pin fixation.  Wounds are well-healing.  Complete antibiotic course as prescribed.  Will plan to see next week for wound check and suture removal.  Follow-up: No follow-ups on file.   Meds & Orders: No orders of the defined types were placed in this encounter.   Orders Placed This Encounter  Procedures   XR Hand Complete Right     Procedures: No procedures performed       Objective:   Vital Signs: There were no vitals taken for this visit.  Ortho Exam Right index finger with pin in place, pin site clean dry and intact Index fingers warm well-perfused, nailbed is well secured within the eponychial fold Sutures in place, skin edges well-approximated, no erythema or drainage  Imaging: XR Hand Complete Right  Result Date: 12/17/2022 X-rays of the right hand demonstrate stable appearance of index finger distal phalanx fracture with pin fixation    Ryenne Lynam Fara Boros) Soua Lenk, M.D. [facility] 1:20 PM

## 2022-12-22 ENCOUNTER — Ambulatory Visit (INDEPENDENT_AMBULATORY_CARE_PROVIDER_SITE_OTHER): Payer: Self-pay | Admitting: Orthopedic Surgery

## 2022-12-22 ENCOUNTER — Other Ambulatory Visit (INDEPENDENT_AMBULATORY_CARE_PROVIDER_SITE_OTHER): Payer: Self-pay

## 2022-12-22 ENCOUNTER — Encounter: Payer: Self-pay | Admitting: Orthopedic Surgery

## 2022-12-22 DIAGNOSIS — S62630B Displaced fracture of distal phalanx of right index finger, initial encounter for open fracture: Secondary | ICD-10-CM

## 2022-12-22 NOTE — Progress Notes (Signed)
   Deundra Lacewell - 42 y.o. male MRN [id number]  Date of birth: 06-08-1980  Office Visit Note: Visit Date: 12/22/2022 PCP: Patient, No Pcp Per Referred by: No ref. provider found  Subjective:  HPI: Javonnie Dicken is a 42 y.o. male who presents today for follow up 2 weeks status post irrigation and debridement right long index finger.  Pin came out on its own he states, pain is controlled.  Pertinent ROS were reviewed with the patient and found to be negative unless otherwise specified above in HPI.   Assessment & Plan: Visit Diagnoses:  1. Open displaced fracture of distal phalanx of right index finger, initial encounter     Plan: Sutures removed today.  X-rays were reviewed which do show stable appearance of the distal phalanx fracture despite spontaneous pin removal.  Cap splint reapplied today, he is instructed to maintain splinting of the distal phalanx for an additional 3 weeks to allow for ongoing healing.  No use of the right hand during that time.  Follow-up in 3 weeks for clinical and radiographic check.  Follow-up: No follow-ups on file.   Meds & Orders: No orders of the defined types were placed in this encounter.   Orders Placed This Encounter  Procedures   XR Hand Complete Right     Procedures: No procedures performed       Objective:   Vital Signs: There were no vitals taken for this visit.  Ortho Exam Right index finger: - Well-healing laceration site, sutures in place, skin edges well-approximated - Mild tenderness elicited over the distal phalanx, appropriate capillary refill, sensation is intact  Imaging: XR Hand Complete Right  Result Date: 12/22/2022 X-rays of the right hand demonstrate stable appearance of the index finger distal phalanx fracture, DIP joint is well-maintained, fracture is well aligned in both the AP and lateral planes.  There is notable bone loss.    Aedan Geimer Trevor Mace, M.D. [facility] 11:07 AM

## 2023-01-12 ENCOUNTER — Ambulatory Visit (INDEPENDENT_AMBULATORY_CARE_PROVIDER_SITE_OTHER): Payer: Self-pay | Admitting: Orthopedic Surgery

## 2023-01-12 ENCOUNTER — Other Ambulatory Visit (INDEPENDENT_AMBULATORY_CARE_PROVIDER_SITE_OTHER): Payer: Self-pay

## 2023-01-12 DIAGNOSIS — S62630B Displaced fracture of distal phalanx of right index finger, initial encounter for open fracture: Secondary | ICD-10-CM

## 2023-01-12 NOTE — Progress Notes (Signed)
   Terry Ramirez - 42 y.o. male MRN [id number]  Date of birth: 1980/03/09  Office Visit Note: Visit Date: 01/12/2023 PCP: Patient, No Pcp Per Referred by: No ref. provider found  Subjective:  HPI: Terry Ramirez is a 42 y.o. male who presents today for follow up 5 weeks status post irrigation and debridement with pinning right index finger for open distal phalanx fracture.  He is doing well overall, pain is controlled.  Pertinent ROS were reviewed with the patient and found to be negative unless otherwise specified above in HPI.   Assessment & Plan: Visit Diagnoses:  1. Open displaced fracture of distal phalanx of right index finger, initial encounter     Plan: He is doing well postoperatively.  X-rays reviewed today which show stable appearance of the distal phalanx fracture with ongoing healing and some bony consolidation.  Clinically, he is doing very well and has no pain.  I would like him to continue utilizing cap splint of the distal phalanx while at work, can come out of the splint for range of motion in a controlled setting.  Follow-up in approximately 3 to 4 weeks for repeat exam and radiographs.  Follow-up: Return in about 4 weeks (around 02/09/2023).   Meds & Orders: No orders of the defined types were placed in this encounter.   Orders Placed This Encounter  Procedures   XR Hand Complete Right     Procedures: No procedures performed       Objective:   Vital Signs: There were no vitals taken for this visit.  Ortho Exam Right index finger: - No tenderness over the distal phalanx, appropriate range of motion, able to form composite fist without restriction - Digit is warm well-perfused  Imaging: XR Hand Complete Right  Result Date: 01/12/2023 X-rays of the right hand were completed today, focus on the index finger distal phalanx fracture which shows ongoing fracture consolidation, stable appearance of the DIP joint in all planes.    Jailine Lieder Trevor Mace, M.D. [facility] 9:59 AM

## 2023-02-09 ENCOUNTER — Other Ambulatory Visit (INDEPENDENT_AMBULATORY_CARE_PROVIDER_SITE_OTHER): Payer: Self-pay

## 2023-02-09 ENCOUNTER — Ambulatory Visit (INDEPENDENT_AMBULATORY_CARE_PROVIDER_SITE_OTHER): Payer: Self-pay | Admitting: Orthopedic Surgery

## 2023-02-09 DIAGNOSIS — S62630B Displaced fracture of distal phalanx of right index finger, initial encounter for open fracture: Secondary | ICD-10-CM

## 2023-02-09 NOTE — Progress Notes (Signed)
   Terry Ramirez - 42 y.o. male MRN [id number]  Date of birth: 02-Aug-1980  Office Visit Note: Visit Date: 02/09/2023 PCP: Patient, No Pcp Per Referred by: No ref. provider found  Subjective:  HPI: Terry Ramirez is a 42 y.o. male who presents today for follow up 9 weeks status post right long index finger irrigation and debridement with pinning for distal phalanx fracture.  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  Pertinent ROS were reviewed with the patient and found to be negative unless otherwise specified above in HPI.   Assessment & Plan: Visit Diagnoses:  1. Open displaced fracture of distal phalanx of right index finger, initial encounter     Plan: He continues to do well postoperatively.  Given the significant comminution and displacement of his initial injury, there is evidence of bone void in the distal phalanx still on radiographs, however clinically he is doing quite well without tenderness and has full use of the index finger currently.  I will see him back approximately 8 weeks for repeat clinical and radiographic check to monitor for bone filling in at this region.  Follow-up: No follow-ups on file.   Meds & Orders: No orders of the defined types were placed in this encounter.   Orders Placed This Encounter  Procedures   XR Hand Complete Right     Procedures: No procedures performed       Objective:   Vital Signs: There were no vitals taken for this visit.  Ortho Exam Right index finger: - Minimal tenderness at the distal phalanx - Able to range of motion of the DIP and PIP of the ring finger, composite fist without restriction - Pinch strength of the index finger 7, compared to contralateral index finger 12  Imaging: XR Hand Complete Right Result Date: 02/09/2023 X-rays of the right hand, focused on the index finger X-rays at the index finger demonstrate distal phalanx fracture with ongoing  bone void secondary to the prior comminuted distal phalanx fracture.  No interval displacement, DIP joint remains well located in all planes.    Cherissa Hook Trevor Mace, M.D. [facility] 10:11 AM

## 2023-04-06 ENCOUNTER — Ambulatory Visit: Payer: Self-pay | Admitting: Orthopedic Surgery
